# Patient Record
Sex: Female | Born: 1995 | Race: Black or African American | Hispanic: No | Marital: Single | Smoking: Light tobacco smoker
Health system: Southern US, Community
[De-identification: ages and names within clinical notes are randomized; demographics above are authoritative.]

---

## 2015-09-30 ENCOUNTER — Encounter (HOSPITAL_COMMUNITY): Payer: Self-pay | Admitting: Family Medicine

## 2015-09-30 ENCOUNTER — Emergency Department (HOSPITAL_COMMUNITY)
Admission: EM | Admit: 2015-09-30 | Discharge: 2015-09-30 | Discharge status: Against Medical Advice | Payer: BLUE CROSS/BLUE SHIELD | Attending: Emergency Medicine | Admitting: Emergency Medicine

## 2015-09-30 DIAGNOSIS — R202 Paresthesia of skin: Secondary | ICD-10-CM | POA: Insufficient documentation

## 2015-09-30 DIAGNOSIS — F17210 Nicotine dependence, cigarettes, uncomplicated: Secondary | ICD-10-CM | POA: Diagnosis not present

## 2015-09-30 DIAGNOSIS — R200 Anesthesia of skin: Secondary | ICD-10-CM | POA: Insufficient documentation

## 2015-09-30 DIAGNOSIS — R109 Unspecified abdominal pain: Secondary | ICD-10-CM | POA: Insufficient documentation

## 2015-09-30 DIAGNOSIS — R1110 Vomiting, unspecified: Secondary | ICD-10-CM | POA: Diagnosis not present

## 2015-09-30 DIAGNOSIS — M545 Low back pain: Secondary | ICD-10-CM | POA: Insufficient documentation

## 2015-09-30 NOTE — ED Notes (Signed)
Pt is complaining of lower back pain that radiates down both legs. Denies any recent injury. Numbness and tingling to the inner thighs. Denies any loss of bowel or bladder. Also, complains of vomiting once. Mid abd pain before vomiting but has improved since vomiting.

## 2016-05-03 ENCOUNTER — Emergency Department (HOSPITAL_COMMUNITY)
Admission: EM | Admit: 2016-05-03 | Discharge: 2016-05-03 | Disposition: A | Discharge status: Home / Self Care | Payer: BLUE CROSS/BLUE SHIELD | Attending: Emergency Medicine | Admitting: Emergency Medicine

## 2016-05-03 ENCOUNTER — Encounter (HOSPITAL_COMMUNITY): Payer: Self-pay | Admitting: Emergency Medicine

## 2016-05-03 DIAGNOSIS — Y92410 Unspecified street and highway as the place of occurrence of the external cause: Secondary | ICD-10-CM | POA: Diagnosis not present

## 2016-05-03 DIAGNOSIS — Y9389 Activity, other specified: Secondary | ICD-10-CM | POA: Diagnosis not present

## 2016-05-03 DIAGNOSIS — Z7982 Long term (current) use of aspirin: Secondary | ICD-10-CM | POA: Insufficient documentation

## 2016-05-03 DIAGNOSIS — Y999 Unspecified external cause status: Secondary | ICD-10-CM | POA: Diagnosis not present

## 2016-05-03 DIAGNOSIS — F17210 Nicotine dependence, cigarettes, uncomplicated: Secondary | ICD-10-CM | POA: Insufficient documentation

## 2016-05-03 DIAGNOSIS — S161XXA Strain of muscle, fascia and tendon at neck level, initial encounter: Secondary | ICD-10-CM | POA: Diagnosis not present

## 2016-05-03 DIAGNOSIS — S199XXA Unspecified injury of neck, initial encounter: Secondary | ICD-10-CM | POA: Diagnosis present

## 2016-05-03 MED ORDER — METHOCARBAMOL 500 MG PO TABS: 500.0000 mg | ORAL_TABLET | Freq: Two times a day (BID) | ORAL | 0 refills | Status: DC | PRN
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
## 2016-10-06 ENCOUNTER — Ambulatory Visit (INDEPENDENT_AMBULATORY_CARE_PROVIDER_SITE_OTHER): Payer: BLUE CROSS/BLUE SHIELD

## 2016-10-06 ENCOUNTER — Ambulatory Visit (INDEPENDENT_AMBULATORY_CARE_PROVIDER_SITE_OTHER): Payer: BLUE CROSS/BLUE SHIELD | Admitting: Physician Assistant

## 2016-10-06 VITALS — BP 104/76 | HR 71 | Temp 97.7°F | Resp 16 | Ht 64.0 in | Wt 107.0 lb

## 2016-10-06 DIAGNOSIS — M25561 Pain in right knee: Secondary | ICD-10-CM

## 2016-10-06 DIAGNOSIS — M25551 Pain in right hip: Secondary | ICD-10-CM

## 2016-10-06 DIAGNOSIS — S76911A Strain of unspecified muscles, fascia and tendons at thigh level, right thigh, initial encounter: Secondary | ICD-10-CM

## 2016-10-06 MED ORDER — MELOXICAM 7.5 MG PO TABS: 7.5000 mg | ORAL_TABLET | Freq: Every day | ORAL | 1 refills | Status: AC

## 2016-10-06 NOTE — Patient Instructions (Addendum)
Meloxicam is an antiinflammatory. Please do not take other medications like ibuprofen or motrin while taking this. Max dose is 15 mg/day. Take 2 as needed for pain.  Ortho will call you to schedule an appointment.   Patient is to perform exercises below at 5 sets with 10 repetitions. Stretches are to be performed for 5 sets, 10 seconds each. Recommended she perform this rehab twice daily within pain tolerance for 2 weeks.  Go to [facility] medical supply and buy a knee sleeve. Apply heat and/or ice as needed for pain.  Address: [address] [place], [place] [address]  Phone: [phone]  Thank you for coming in today. I hope you feel we met your needs.  Feel free to call [facility] if you have any questions or further requests.  Please consider signing up for MyChart if you do not already have it, as this is a great way to communicate with me.  Best,  Mercer Pod, PA-C  Hamstring Strain Rehab Ask your health care provider which exercises are safe for you. Do exercises exactly as told by your health care provider and adjust them as directed. It is normal to feel mild stretching, pulling, tightness, or discomfort as you do these exercises, but you should stop right away if you feel sudden pain or your pain gets worse.Do not begin these exercises until told by your health care provider. Strengthening exercises These exercises build strength and endurance in your thighs. Endurance is the ability to use your muscles for a long time, even after your muscles get tired. Exercise A: Straight leg raises (hip extensors) 1. Lie on your belly on a firm surface. 2. Tense the muscles in your buttocks to lift your left / right leg about 4 inches (10 cm). Keep your knee straight. 3. If you cannot lift your leg this high without arching your back, place a pillow under your hips. 4. Hold the position for __________ seconds. 5. Slowly lower your leg to the starting position and allow it to relax completely before  you start the next repetition. Repeat __________ times. Complete this exercise __________ times a day. Exercise B: Bridge (hip extensors) 1. Lie on your back on a firm surface with your knees bent and your feet flat on the floor. 2. Tighten your buttocks muscles and lift your bottom off the floor until your trunk is level with your thighs.  You should feel the muscles working in your buttocks and the back of your thighs.  Do not arch your back. 3. Hold this position for __________ seconds. 4. Slowly lower your hips to the starting position. 5. Let your buttocks muscles relax completely between repetitions. Repeat __________ times. Complete this exercise __________ times a day. If told by your health care provider, keep your bottom lifted off the floor while you slowly walk your feet away from you as far as you can control. Hold for __________ seconds, then slowly walk your feet back toward you. Exercise C: Lateral walking with band (hip abductors) 1. Stand in a long hallway. 2. Wrap a loop of exercise band around your legs, just above your knees. 3. Bend your knees gently and drop your hips down and back so your weight is over your heels. 4. Step to the side to move down the length of the hallway, keeping your toes pointed ahead of you and keeping tension in the band. 5. Repeat, leading with your other leg. Repeat __________ times. Complete this exercise __________ times a day. Exercise D: Single  leg stand with reaching (eccentric hamstring) 1. Stand on your left / right foot. Keep your big toe down on the floor and try to keep your arch lifted. 2. Slowly reach down toward the floor as far as you can while keeping your balance. 3. Hold this position for __________ seconds. Repeat __________ times. Complete this exercise __________ times a day. Exercise E: Prone plank (abdominals and core) 1. Lie on your belly on the floor and prop yourself up on your elbows. Your hands should be straight  out in front of you, and your elbows should be below your shoulders. Position your feet so the balls of your feet touch the ground. The ball of your foot is on the walking surface, right under your toes. 2. Tighten your abdominal muscles and lift your body off the floor.  Do not arch your back.  Do not hold your breath. 3. Hold this position for __________ seconds. Repeat __________ times. Complete this exercise __________ times a day. This information is not intended to replace advice given to you by your health care provider. Make sure you discuss any questions you have with your health care provider. Document Released: 08/30/2005 Document Revised: 05/06/2016 Document Reviewed: 05/29/2015 Elsevier Interactive Patient Education  2017 [employer].  IF you received an x-ray today, you will receive an invoice from [facility]. Please contact [facility] at [phone] with questions or concerns regarding your invoice.   IF you received labwork today, you will receive an invoice from [facility]. Please contact [facility] at [phone] with questions or concerns regarding your invoice.   Our billing staff will not be able to assist you with questions regarding bills from these companies.  You will be contacted with the lab results as soon as they are available. The fastest way to get your results is to activate your My Chart account. Instructions are located on the last page of this paperwork. If you have not heard from [place] regarding the results in 2 weeks, please contact this office.

## 2016-10-06 NOTE — Progress Notes (Signed)
Julie Weiss  MRN: [id number] DOB: 1996-05-17  PCP: No PCP Per Patient  Subjective:  Pt is a 21 year old female who presents to clinic for leg pain x 2 weeks.  Mostly in the inside of her right knee. Pain is a 7/10.  She slipped in the snow, twisting her knee, which made the pain worse - pain was present about a day before this - Felt a pop on the inside of her right knee. Immediate swelling +swelling x a few days, better now. Pain is constant, even during sleep. Nothing makes it better.  She sleeps in fetal position, the pressure of knee-on-knee hurt, despite which side she is lying on. Increased pain when she bends.  She has tried ice, elevation, Aspirin, helps some then gets worse again. No history of knee problems.  Ran track and cheered in highschool. No sports at this time.  Denies redness, warmth, fever, chills.   Review of Systems  Constitutional: Negative for chills, diaphoresis, fatigue and fever.  Respiratory: Negative for cough, chest tightness, shortness of breath and wheezing.   Cardiovascular: Negative for chest pain and palpitations.  Gastrointestinal: Negative for abdominal pain, diarrhea, nausea and vomiting.  Musculoskeletal: Positive for arthralgias (right knee), gait problem, joint swelling and myalgias (right thigh).  Skin: Negative.   Neurological: Negative for weakness, light-headedness and headaches.  Psychiatric/Behavioral: Positive for sleep disturbance.    There are no active problems to display for this patient.   No current outpatient prescriptions on file prior to visit.   No current facility-administered medications on file prior to visit.     No Known Allergies   Objective:  BP 104/76   Pulse 71   Temp 97.7 F (36.5 C) (Oral)   Resp 16   Ht 5\' 4"  (1.626 m)   Wt 107 lb (48.5 kg)   LMP 10/06/2016   SpO2 100%   BMI 18.37 kg/m   Physical Exam  Constitutional: She is oriented to person, place, and time and well-developed, well-nourished,  and in no distress. No distress.  Cardiovascular: Normal rate, regular rhythm and normal heart sounds.   Musculoskeletal:       Right knee: She exhibits abnormal meniscus. She exhibits normal range of motion, no swelling, no effusion, no deformity, no erythema, normal alignment, no LCL laxity and no MCL laxity. No medial joint line, no lateral joint line, no MCL, no LCL and no patellar tendon tenderness noted.  Pt walks with a limp favoring right leg. Cannot squat past 100 degrees due to pain. TTP medial femoral epicondyle. TTP along medial right quadriceps. Pain radiating to hip with external hip rotation. No redness, swelling, warmth. Strength 5/5. Sensation in tact.   Neurological: She is alert and oriented to person, place, and time. GCS score is 15.  Skin: Skin is warm and dry.  Psychiatric: Mood, memory, affect and judgment normal.  Vitals reviewed.  Dg Knee Complete 4 Views Right  Result Date: 10/06/2016 CLINICAL DATA:  Knee pain. EXAM: RIGHT KNEE - COMPLETE 4+ VIEW COMPARISON:  No recent prior . FINDINGS: No acute bony or joint abnormality identified. No evidence of fracture dislocation. IMPRESSION: No acute or focal abnormality. Electronically Signed   By: Maisie Fus  Register   On: 10/06/2016 09:25   Dg Hip Unilat W Or W/o Pelvis 2-3 Views Right  Result Date: 10/06/2016 CLINICAL DATA:  Right hip pain for 1 week. EXAM: DG HIP (WITH OR WITHOUT PELVIS) 2-3V RIGHT COMPARISON:  None. FINDINGS: Pelvic bony ring is intact.  No gross abnormality to either hip. Right hip is located without a fracture. Gas and stool in the colon. Calcification in the right hemipelvis probably represents a phlebolith. IMPRESSION: No acute abnormality. Electronically Signed   By: Richarda OverlieAdam  Henn M.D.   On: 10/06/2016 10:15    Assessment and Plan :  1. Muscle strain of right thigh, initial encounter 2. Acute pain of right knee 3. Pain of right hip joint - Ambulatory referral to Orthopedic Surgery - DG Knee Complete 4  Views Right; Future - meloxicam (MOBIC) 7.5 MG tablet; Take 1 tablet (7.5 mg total) by mouth daily.  Dispense: 30 tablet; Refill: 1 - DG HIP UNILAT W OR W/O PELVIS 2-3 VIEWS RIGHT; Future - Suspect possible muscle strain. Supportive care discussed: heat/ice, NSAID, stretching. RTC if symptoms worsen. Will refer to ortho, concern for possible meniscal injury.   Marco CollieWhitney Diogenes Whirley, PA-C  [facility] 10/06/2016 8:51 AM

## 2016-10-16 ENCOUNTER — Emergency Department (HOSPITAL_COMMUNITY)
Admission: EM | Admit: 2016-10-16 | Discharge: 2016-10-16 | Disposition: A | Discharge status: Home / Self Care | Payer: BLUE CROSS/BLUE SHIELD | Attending: Emergency Medicine | Admitting: Emergency Medicine

## 2016-10-16 ENCOUNTER — Encounter (HOSPITAL_COMMUNITY): Payer: Self-pay | Admitting: Emergency Medicine

## 2016-10-16 ENCOUNTER — Emergency Department (HOSPITAL_COMMUNITY): Payer: BLUE CROSS/BLUE SHIELD

## 2016-10-16 DIAGNOSIS — Z7982 Long term (current) use of aspirin: Secondary | ICD-10-CM | POA: Diagnosis not present

## 2016-10-16 DIAGNOSIS — B9689 Other specified bacterial agents as the cause of diseases classified elsewhere: Secondary | ICD-10-CM | POA: Insufficient documentation

## 2016-10-16 DIAGNOSIS — N760 Acute vaginitis: Secondary | ICD-10-CM | POA: Insufficient documentation

## 2016-10-16 DIAGNOSIS — F17210 Nicotine dependence, cigarettes, uncomplicated: Secondary | ICD-10-CM | POA: Insufficient documentation

## 2016-10-16 DIAGNOSIS — N72 Inflammatory disease of cervix uteri: Secondary | ICD-10-CM | POA: Insufficient documentation

## 2016-10-16 DIAGNOSIS — Z79899 Other long term (current) drug therapy: Secondary | ICD-10-CM | POA: Diagnosis not present

## 2016-10-16 DIAGNOSIS — R109 Unspecified abdominal pain: Secondary | ICD-10-CM | POA: Diagnosis present

## 2016-10-16 DIAGNOSIS — N390 Urinary tract infection, site not specified: Secondary | ICD-10-CM | POA: Insufficient documentation

## 2016-10-16 DIAGNOSIS — R1030 Lower abdominal pain, unspecified: Secondary | ICD-10-CM

## 2016-10-16 DIAGNOSIS — R1031 Right lower quadrant pain: Secondary | ICD-10-CM | POA: Diagnosis not present

## 2016-10-16 LAB — URINALYSIS, ROUTINE W REFLEX MICROSCOPIC
Bilirubin Urine: NEGATIVE
GLUCOSE, UA: NEGATIVE mg/dL
Hgb urine dipstick: NEGATIVE
Ketones, ur: NEGATIVE mg/dL
Nitrite: NEGATIVE
PROTEIN: NEGATIVE mg/dL
Specific Gravity, Urine: 1.004 — ABNORMAL LOW (ref 1.005–1.030)
pH: 6 (ref 5.0–8.0)

## 2016-10-16 LAB — COMPREHENSIVE METABOLIC PANEL
ALT: 9 U/L — AB (ref 14–54)
AST: 17 U/L (ref 15–41)
Albumin: 3.9 g/dL (ref 3.5–5.0)
Alkaline Phosphatase: 50 U/L (ref 38–126)
Anion gap: 6 (ref 5–15)
BUN: 11 mg/dL (ref 6–20)
CHLORIDE: 100 mmol/L — AB (ref 101–111)
CO2: 29 mmol/L (ref 22–32)
CREATININE: 0.87 mg/dL (ref 0.44–1.00)
Calcium: 9 mg/dL (ref 8.9–10.3)
GFR calc non Af Amer: >60 mL/min (ref >60)
Glucose, Bld: 111 mg/dL — ABNORMAL HIGH (ref 65–99)
POTASSIUM: 3.6 mmol/L (ref 3.5–5.1)
SODIUM: 135 mmol/L (ref 135–145)
Total Bilirubin: 0.4 mg/dL (ref 0.3–1.2)
Total Protein: 8.5 g/dL — ABNORMAL HIGH (ref 6.5–8.1)

## 2016-10-16 LAB — LIPASE, BLOOD: LIPASE: 14 U/L (ref 11–51)

## 2016-10-16 LAB — CBC
HEMATOCRIT: 33 % — AB (ref 36.0–46.0)
Hemoglobin: 11.4 g/dL — ABNORMAL LOW (ref 12.0–15.0)
MCH: 32.4 pg (ref 26.0–34.0)
MCHC: 34.5 g/dL (ref 30.0–36.0)
MCV: 93.8 fL (ref 78.0–100.0)
PLATELETS: 336 10*3/uL (ref 150–400)
RBC: 3.52 MIL/uL — ABNORMAL LOW (ref 3.87–5.11)
RDW: 11.6 % (ref 11.5–15.5)
WBC: 7.9 10*3/uL (ref 4.0–10.5)

## 2016-10-16 LAB — I-STAT BETA HCG BLOOD, ED (MC, WL, AP ONLY)

## 2016-10-16 LAB — WET PREP, GENITAL
Sperm: NONE SEEN
Trich, Wet Prep: NONE SEEN
YEAST WET PREP: NONE SEEN

## 2016-10-16 MED ORDER — SODIUM CHLORIDE 0.9 % IJ SOLN
INTRAMUSCULAR | Status: AC
  Filled 2016-10-16: qty 50

## 2016-10-16 MED ORDER — HYDROCODONE-ACETAMINOPHEN 5-325 MG PO TABS: 1.0000 | ORAL_TABLET | ORAL | 0 refills | Status: AC | PRN

## 2016-10-16 MED ORDER — CEPHALEXIN 500 MG PO CAPS: 500.0000 mg | ORAL_CAPSULE | Freq: Three times a day (TID) | ORAL | 0 refills | Status: AC

## 2016-10-16 MED ORDER — IBUPROFEN 600 MG PO TABS: 600.0000 mg | ORAL_TABLET | Freq: Four times a day (QID) | ORAL | 0 refills | Status: AC | PRN

## 2016-10-16 MED ORDER — AZITHROMYCIN 250 MG PO TABS
1000.0000 mg | ORAL_TABLET | Freq: Once | ORAL | Status: AC
  Administered 2016-10-16: 1000 mg via ORAL
  Filled 2016-10-16: qty 4

## 2016-10-16 MED ORDER — ONDANSETRON HCL 4 MG/2ML IJ SOLN
4.0000 mg | Freq: Once | INTRAMUSCULAR | Status: AC
Start: 2016-10-16 — End: 2016-10-16
  Administered 2016-10-16: 4 mg via INTRAVENOUS
  Filled 2016-10-16: qty 2

## 2016-10-16 MED ORDER — DEXTROSE 5 % IV SOLN
1.0000 g | Freq: Once | INTRAVENOUS | Status: AC
  Administered 2016-10-16: 1 g via INTRAVENOUS
  Filled 2016-10-16: qty 10

## 2016-10-16 MED ORDER — MORPHINE SULFATE (PF) 2 MG/ML IV SOLN
2.0000 mg | Freq: Once | INTRAVENOUS | Status: AC
  Administered 2016-10-16: 2 mg via INTRAVENOUS
  Filled 2016-10-16: qty 1

## 2016-10-16 MED ORDER — SODIUM CHLORIDE 0.9 % IV BOLUS (SEPSIS)
1000.0000 mL | Freq: Once | INTRAVENOUS | Status: AC
  Administered 2016-10-16: 1000 mL via INTRAVENOUS

## 2016-10-16 MED ORDER — METRONIDAZOLE 500 MG PO TABS
500.0000 mg | ORAL_TABLET | Freq: Two times a day (BID) | ORAL | 0 refills | Status: AC
Start: 2016-10-16 — End: ?

## 2016-10-16 MED ORDER — ONDANSETRON HCL 4 MG PO TABS: 4.0000 mg | ORAL_TABLET | Freq: Three times a day (TID) | ORAL | 0 refills | Status: AC | PRN

## 2016-10-16 MED ORDER — IOPAMIDOL (ISOVUE-300) INJECTION 61%
INTRAVENOUS | Status: AC
  Filled 2016-10-16: qty 100

## 2016-10-16 MED ORDER — IOPAMIDOL (ISOVUE-300) INJECTION 61%
INTRAVENOUS | Status: AC
  Administered 2016-10-16: 100 mL via INTRAVENOUS
  Filled 2016-10-16: qty 30

## 2016-10-16 MED ORDER — IOPAMIDOL (ISOVUE-300) INJECTION 61%
30.0000 mL | Freq: Once | INTRAVENOUS | Status: AC | PRN
  Administered 2016-10-16: 30 mL via ORAL

## 2016-10-16 NOTE — ED Provider Notes (Signed)
[facility]-EMERGENCY DEPT Provider Note   CSN: [id number] Arrival date & time: 10/16/16  1713     History   Chief Complaint Chief Complaint  Patient presents with  . Abdominal Pain  . Diarrhea    HPI Julie Weiss is a 21 y.o. female.  Patient presents with 4 days of gradual onset right-sided abdominal pain. Pain is worse with movement and lying flat. Denies any nausea or vomiting. Patient states she took a laxative 2 days ago after having 2 days of constipation. She had several loose stools. No blood in the stool. No relief of her symptoms. She's not had any fever or chills. She denies any dysuria, frequency or urgency. She does state that her urine has been darker than normal. Last menstrual period was the end of January. Denies any vaginal bleeding or discharge. No history of previous abdominal surgeries.   HPI History reviewed. No pertinent past medical history.  There are no active problems to display for this patient.   History reviewed. No pertinent surgical history.  OB History    No data available       Home Medications    Prior to Admission medications   Medication Sig Start Date End Date Taking? Authorizing Provider  aspirin 325 MG tablet Take 650 mg by mouth once.   Yes Historical Provider, MD  cephALEXin (KEFLEX) 500 MG capsule Take 1 capsule (500 mg total) by mouth 3 (three) times daily. 10/16/16   Loren Raceravid Robinson Brinkley, MD  HYDROcodone-acetaminophen (NORCO) 5-325 MG tablet Take 1 tablet by mouth every 4 (four) hours as needed for severe pain. 10/16/16   Loren Raceravid Royce Sciara, MD  ibuprofen (ADVIL,MOTRIN) 600 MG tablet Take 1 tablet (600 mg total) by mouth every 6 (six) hours as needed. 10/16/16   Loren Raceravid Dorrien Grunder, MD  meloxicam (MOBIC) 7.5 MG tablet Take 1 tablet (7.5 mg total) by mouth daily. Patient not taking: Reported on 10/16/2016 10/06/16   Madelaine BhatElizabeth Whitney McVey, PA-C  metroNIDAZOLE (FLAGYL) 500 MG tablet Take 1 tablet (500 mg total) by mouth 2 (two) times daily. One po bid x 7  days 10/16/16   Loren Raceravid Anacleto Batterman, MD  ondansetron (ZOFRAN) 4 MG tablet Take 1 tablet (4 mg total) by mouth every 8 (eight) hours as needed for nausea or vomiting. 10/16/16   Loren Raceravid Ruey Storer, MD    Family History No family history on file.  Social History Social History  Substance Use Topics  . Smoking status: Light Tobacco Smoker    Types: Cigarettes  . Smokeless tobacco: Never Used  . Alcohol use Yes     Comment: Once every couple of weeks.      Allergies   Patient has no known allergies.   Review of Systems Review of Systems  Constitutional: Negative for chills, fatigue and fever.  Respiratory: Negative for cough and shortness of breath.   Cardiovascular: Negative for chest pain and palpitations.  Gastrointestinal: Positive for abdominal pain and constipation. Negative for abdominal distention, blood in stool, diarrhea, nausea and vomiting.  Genitourinary: Negative for difficulty urinating, dysuria, flank pain, frequency, hematuria, pelvic pain, urgency, vaginal bleeding and vaginal discharge.  Musculoskeletal: Negative for back pain, myalgias, neck pain and neck stiffness.  Skin: Negative for rash and wound.  Neurological: Negative for dizziness, syncope, weakness, light-headedness and numbness.  All other systems reviewed and are negative.    Physical Exam Updated Vital Signs BP 120/76   Pulse 74   Temp 97.9 F (36.6 C) (Oral)   Resp 18   Ht 5\' 4"  (1.626  m)   Wt 110 lb 5 oz (50 kg)   LMP 10/04/2016   SpO2 100%   BMI 18.94 kg/m   Physical Exam  Constitutional: She is oriented to person, place, and time. She appears well-developed and well-nourished.  Patient is tearful  HENT:  Head: Normocephalic and atraumatic.  Mouth/Throat: Oropharynx is clear and moist. No oropharyngeal exudate.  Eyes: EOM are normal. Pupils are equal, round, and reactive to light.  Neck: Normal range of motion. Neck supple.  Cardiovascular: Normal rate and regular rhythm.     Pulmonary/Chest: Effort normal and breath sounds normal.  Abdominal: Soft. Bowel sounds are normal. There is tenderness. There is no rebound and no guarding.  Right upper quadrant and right lower quadrant tenderness to palpation. There is most pronounced in the right lower quadrant. No definite guarding or rebound.  Musculoskeletal: Normal range of motion. She exhibits no edema, tenderness or deformity.  No CVA tenderness bilaterally.  Neurological: She is alert and oriented to person, place, and time.  Skin: Skin is warm and dry. Capillary refill takes less than 2 seconds. No rash noted. No erythema.  Psychiatric: She has a normal mood and affect. Her behavior is normal.  Nursing note and vitals reviewed.    ED Treatments / Results  Labs (all labs ordered are listed, but only abnormal results are displayed) Labs Reviewed  WET PREP, GENITAL - Abnormal; Notable for the following:       Result Value   Clue Cells Wet Prep HPF POC PRESENT (*)    WBC, Wet Prep HPF POC FEW (*)    All other components within normal limits  COMPREHENSIVE METABOLIC PANEL - Abnormal; Notable for the following:    Chloride 100 (*)    Glucose, Bld 111 (*)    Total Protein 8.5 (*)    ALT 9 (*)    All other components within normal limits  CBC - Abnormal; Notable for the following:    RBC 3.52 (*)    Hemoglobin 11.4 (*)    HCT 33.0 (*)    All other components within normal limits  URINALYSIS, ROUTINE W REFLEX MICROSCOPIC - Abnormal; Notable for the following:    Color, Urine STRAW (*)    Specific Gravity, Urine 1.004 (*)    Leukocytes, UA LARGE (*)    Bacteria, UA RARE (*)    Squamous Epithelial / LPF 0-5 (*)    All other components within normal limits  LIPASE, BLOOD  I-STAT BETA HCG BLOOD, ED ([facility], [facility], [facility] ONLY)  GC/CHLAMYDIA PROBE AMP ([place]) NOT AT [facility]    EKG  EKG Interpretation None       Radiology Ct Abdomen Pelvis W Contrast  Result Date: 10/16/2016 CLINICAL DATA:  Right abdominal  pain with associated diarrhea for 4 days. EXAM: CT ABDOMEN AND PELVIS WITH CONTRAST TECHNIQUE: Multidetector CT imaging of the abdomen and pelvis was performed using the standard protocol following bolus administration of intravenous contrast. CONTRAST:  ISOVUE-300 IOPAMIDOL (ISOVUE-300) INJECTION 61%, 30mL ISOVUE-300 IOPAMIDOL (ISOVUE-300) INJECTION 61% COMPARISON:  None. FINDINGS: Lower chest: No acute abnormality. Hepatobiliary: No focal liver abnormality is seen. No gallstones, gallbladder wall thickening, or biliary dilatation. Pancreas: Unremarkable. No pancreatic ductal dilatation or surrounding inflammatory changes. Spleen: Normal in size without focal abnormality. Adrenals/Urinary Tract: Adrenal glands appear normal. Kidneys are unremarkable without mass, stone or hydronephrosis. No ureteral or bladder calculi identified. Bladder appears normal. Stomach/Bowel: Bowel is normal in caliber. No bowel wall thickening or convincing evidence of bowel  wall inflammation seen. Appendix is not seen in its entirety but visualized portion appears normal in caliber and normally air-filled. Stomach appears normal. Vascular/Lymphatic: No significant vascular findings are present. No enlarged abdominal or pelvic lymph nodes. Reproductive: Uterus and bilateral adnexa are unremarkable. Other: Small amount of free fluid in the lower pelvis is likely physiologic in nature. No other free fluid. No abscess collection or free intraperitoneal air seen. Musculoskeletal: No acute or significant osseous findings. IMPRESSION: No acute/significant findings within the abdomen or pelvis. No bowel obstruction or evidence of bowel wall inflammation seen. No significant free fluid. No evidence of acute solid organ abnormality. No renal or ureteral calculi seen. No evidence of appendicitis. Electronically Signed   By: Bary Richard M.D.   On: 10/16/2016 20:49    Procedures Procedures (including critical care time)  Medications  Ordered in ED Medications  morphine 2 MG/ML injection 2 mg (2 mg Intravenous Given 10/16/16 1834)  ondansetron (ZOFRAN) injection 4 mg (4 mg Intravenous Given 10/16/16 1832)  sodium chloride 0.9 % bolus 1,000 mL (0 mLs Intravenous Stopped 10/16/16 1938)  iopamidol (ISOVUE-300) 61 % injection (100 mLs Intravenous Contrast Given 10/16/16 2009)  iopamidol (ISOVUE-300) 61 % injection 30 mL (30 mLs Oral Contrast Given 10/16/16 2009)  cefTRIAXone (ROCEPHIN) 1 g in dextrose 5 % 50 mL IVPB (0 g Intravenous Stopped 10/16/16 2232)  azithromycin (ZITHROMAX) tablet 1,000 mg (1,000 mg Oral Given 10/16/16 2235)     Initial Impression / Assessment and Plan / ED Course  I have reviewed the triage vital signs and the nursing notes.  Pertinent labs & imaging results that were available during my care of the patient were reviewed by me and considered in my medical decision making (see chart for details).     Evidence of UTI on UA. Patient had right-sided adnexal tenderness, cervical motion tenderness and vaginal discharge on pelvic exam per PA. Question PID. Given IV Rocephin and oral azithromycin. Will start on Keflex. Advised follow-up with health Department. Repeat abdominal exam is benign. No rebound or guarding. Return precautions given.  Final Clinical Impressions(s) / ED Diagnoses   Final diagnoses:  Acute lower UTI  Bacterial vaginosis  Cervicitis  Lower abdominal pain    New Prescriptions Discharge Medication List as of 10/16/2016 10:33 PM    START taking these medications   Details  cephALEXin (KEFLEX) 500 MG capsule Take 1 capsule (500 mg total) by mouth 3 (three) times daily., Starting Sat 10/16/2016, Print    HYDROcodone-acetaminophen (NORCO) 5-325 MG tablet Take 1 tablet by mouth every 4 (four) hours as needed for severe pain., Starting Sat 10/16/2016, Print    ibuprofen (ADVIL,MOTRIN) 600 MG tablet Take 1 tablet (600 mg total) by mouth every 6 (six) hours as needed., Starting Sat 10/16/2016, Print      metroNIDAZOLE (FLAGYL) 500 MG tablet Take 1 tablet (500 mg total) by mouth 2 (two) times daily. One po bid x 7 days, Starting Sat 10/16/2016, Print    ondansetron (ZOFRAN) 4 MG tablet Take 1 tablet (4 mg total) by mouth every 8 (eight) hours as needed for nausea or vomiting., Starting Sat 10/16/2016, Print         Loren Racer, MD 10/17/16 1620

## 2016-10-16 NOTE — ED Provider Notes (Signed)
Pelvic Exam Procedure note  Pelvic exam performed by myself with Judeth CornfieldStephanie, RN chaperone   Moderate white/yellow cervical and vaginal discharge; also noted externally prior to exam; no blood noted No adnexal tenderness, masses, or fullness. Positive CMT.   Julie Holeslexandra M Makhia Vosler, PA-C 10/16/16 2041    Loren Raceravid Yelverton, MD 10/20/16 [phone]

## 2016-10-16 NOTE — ED Triage Notes (Signed)
Pt verbalizes right abdominal pain with associated diarrhea for four days; [facility]

## 2016-10-18 LAB — GC/CHLAMYDIA PROBE AMP (~~LOC~~) NOT AT ARMC
CHLAMYDIA, DNA PROBE: POSITIVE — AB
Neisseria Gonorrhea: NEGATIVE

## 2018-03-21 IMAGING — DX DG HIP (WITH OR WITHOUT PELVIS) 2-3V*R*
3 series · 3 of 3 positions shown · non-contrast
Comparison: None.

CLINICAL DATA: Right hip pain for 1 week.

EXAM:
DG HIP (WITH OR WITHOUT PELVIS) 2-3V RIGHT

[pelvis ap]
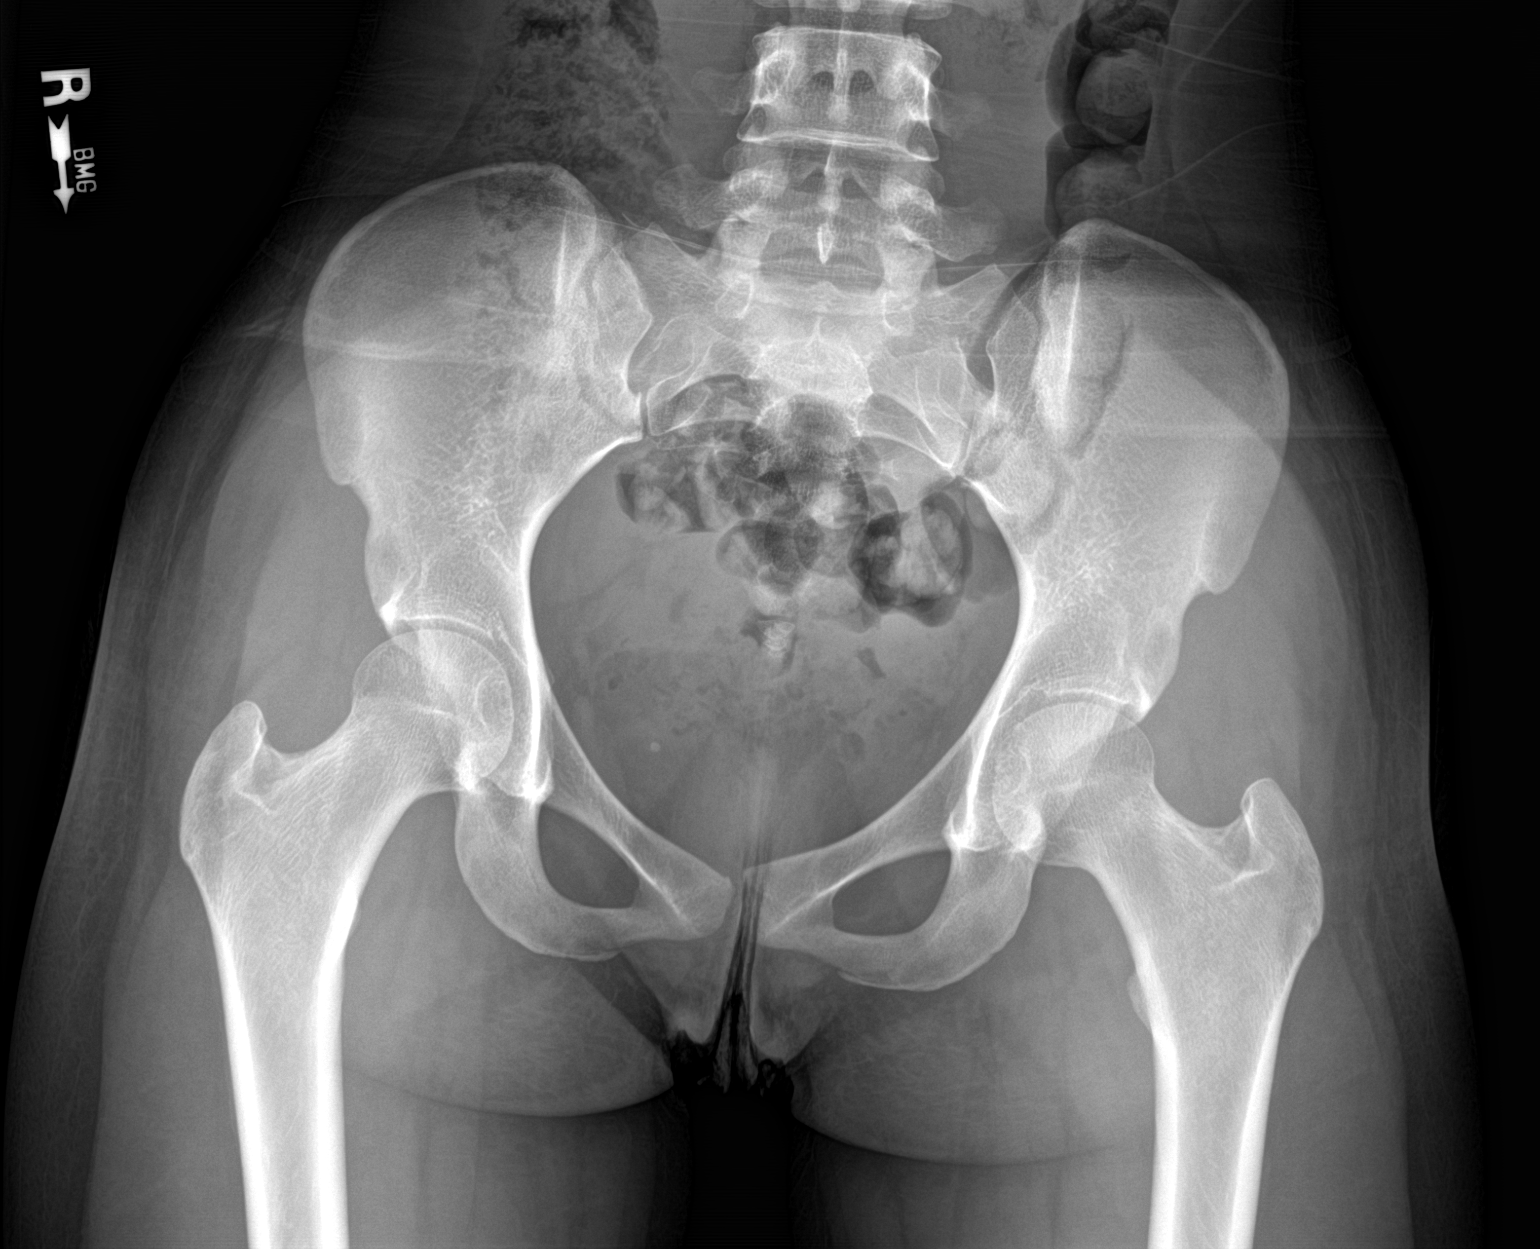

[hip ap]
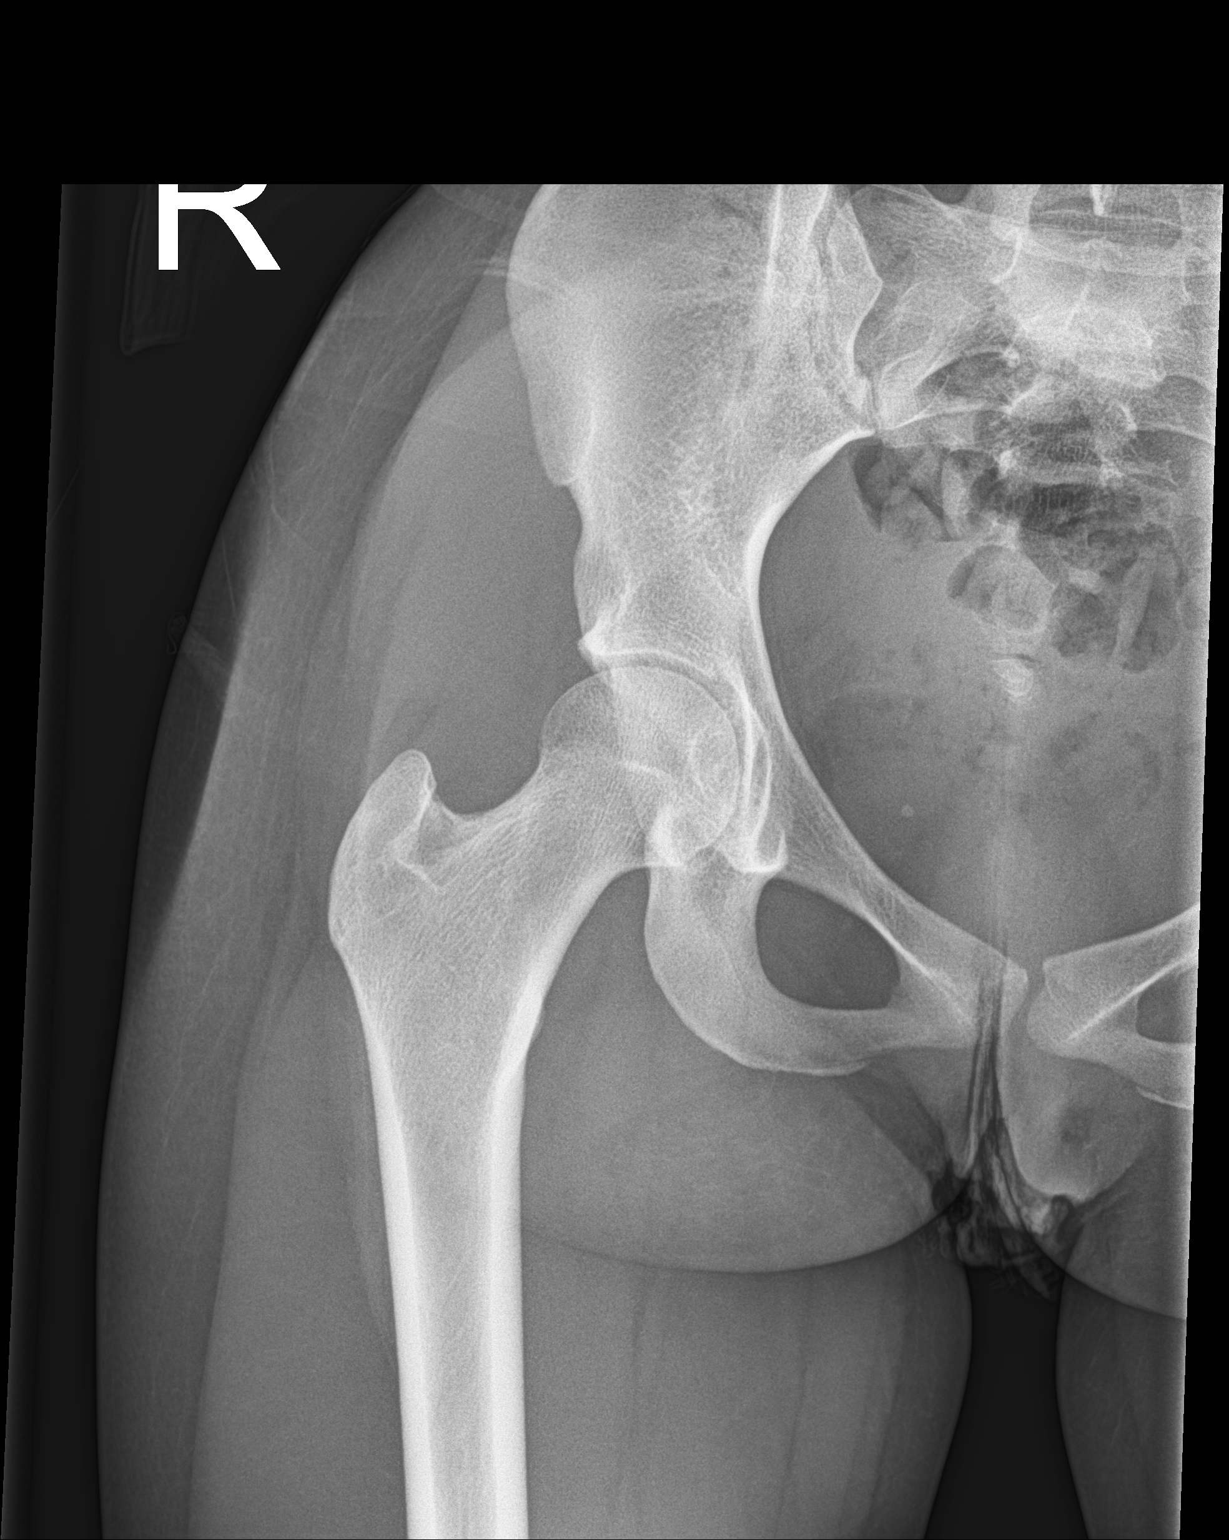

[hip lat]
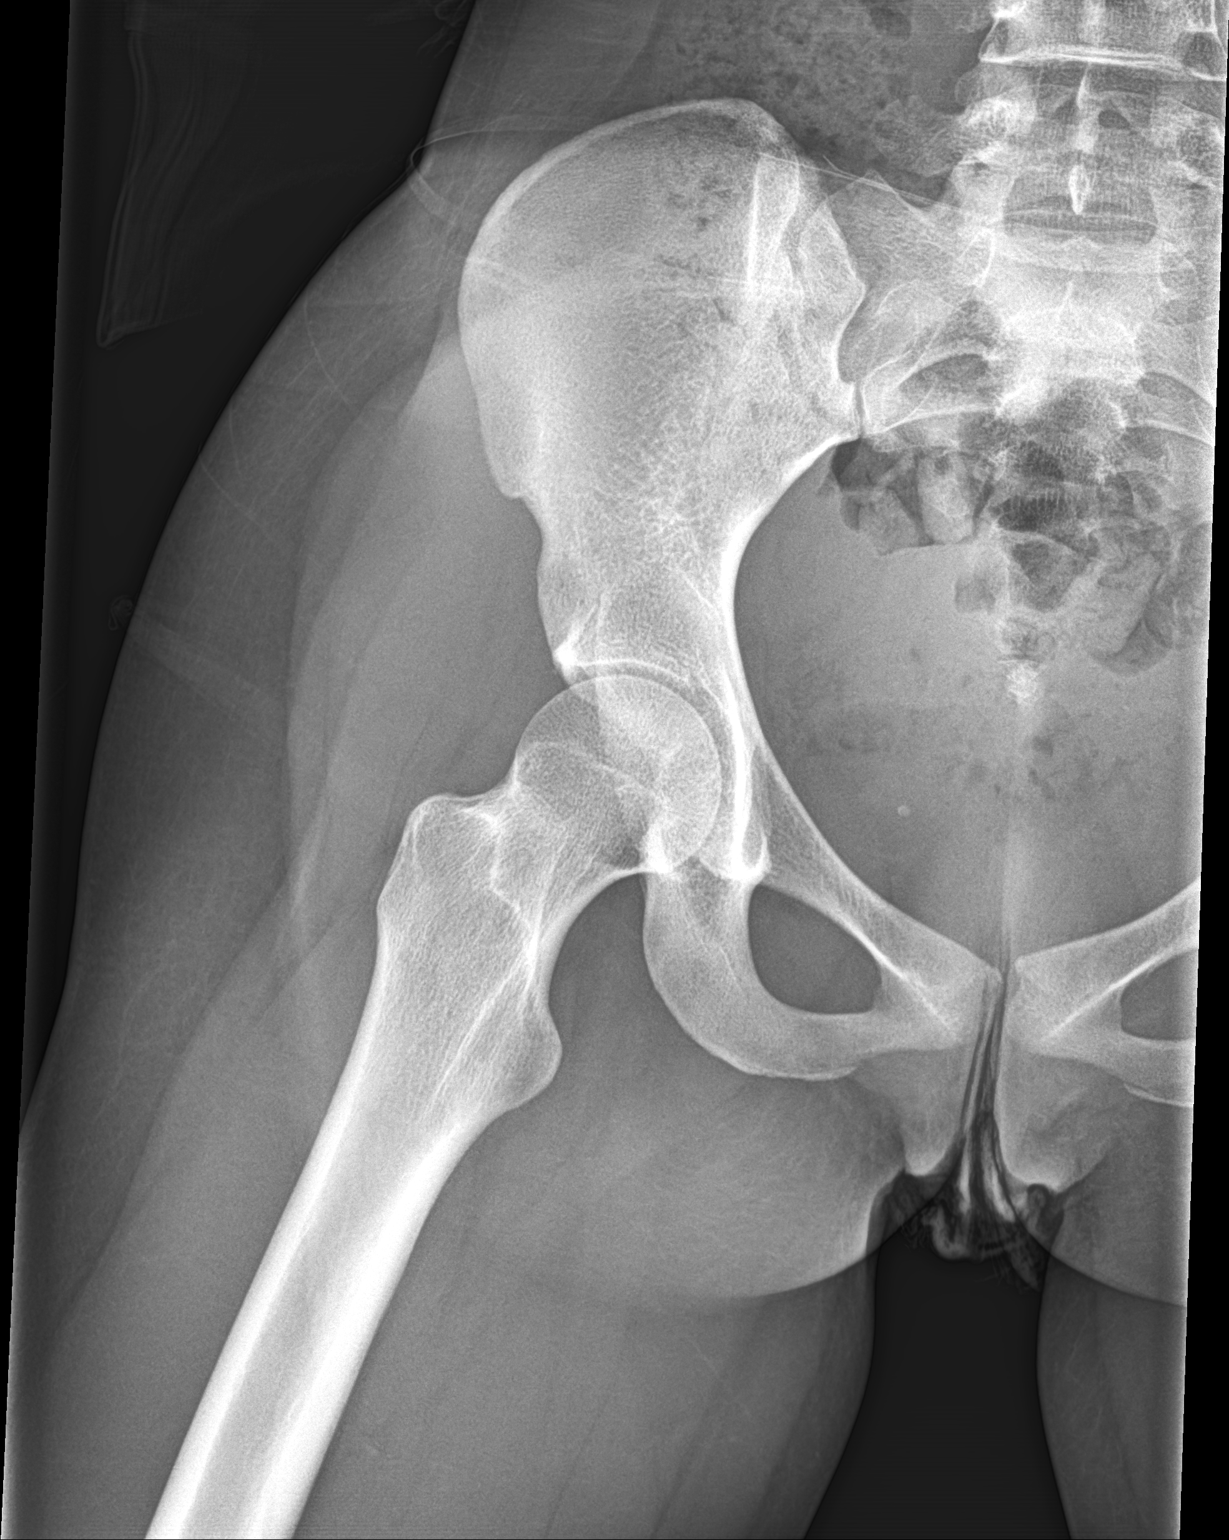

[3 of 3 positions shown; findings below may reference images not displayed]

FINDINGS: Pelvic bony ring is intact. No gross abnormality to either hip.
Right hip is located without a fracture. Gas and stool in the colon.
Calcification in the right hemipelvis probably represents a
phlebolith.
IMPRESSION: No acute abnormality.

## 2018-03-31 IMAGING — CT CT ABD-PELV W/ CM
2 of 4 series · 16 of 46 positions shown, 18 images · IV contrast (ISOVUE)
Comparison: None.

CLINICAL DATA: Right abdominal pain with associated diarrhea for 4
days.

EXAM:
CT ABDOMEN AND PELVIS WITH CONTRAST
TECHNIQUE: Multidetector CT imaging of the abdomen and pelvis was performed
using the standard protocol following bolus administration of
intravenous contrast.
CONTRAST:  100mL 0HK35I-HUU IOPAMIDOL (0HK35I-HUU) INJECTION 61%,
30mL 0HK35I-HUU IOPAMIDOL (0HK35I-HUU) INJECTION 61%

[Series 2: abd/pel with · axial · 0.67mm/px · z∈[-10,+375]mm · 13 of 87 slices shown, 15 images]
[im 5/87  soft-tissue]
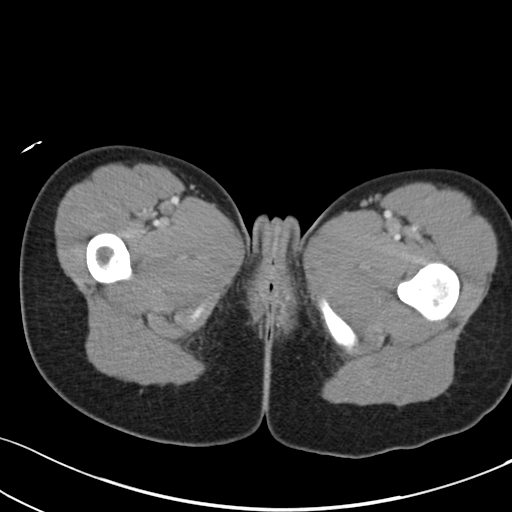
[im 5/87  bone]
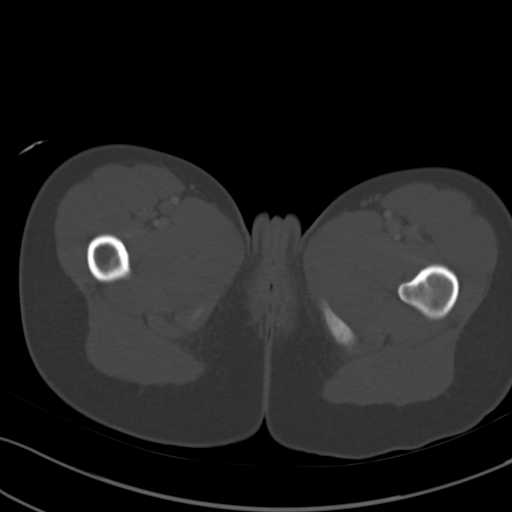
[im 13/87  soft-tissue]
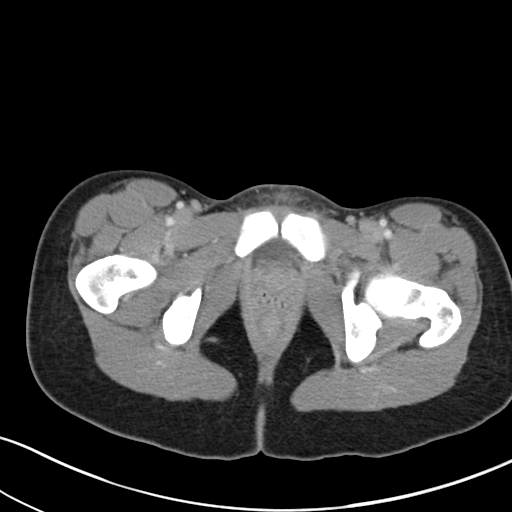
[im 18/87  soft-tissue]
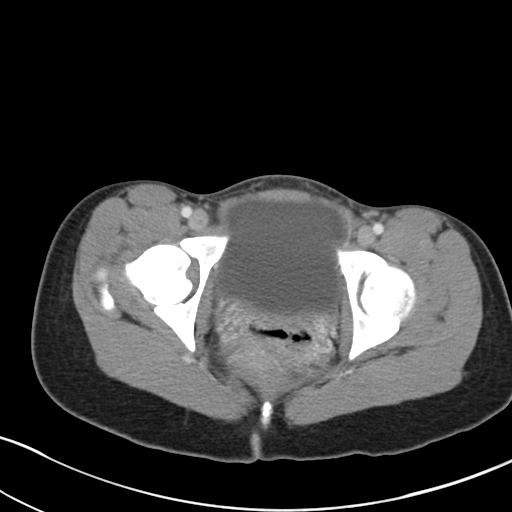
[im 26/87  soft-tissue]
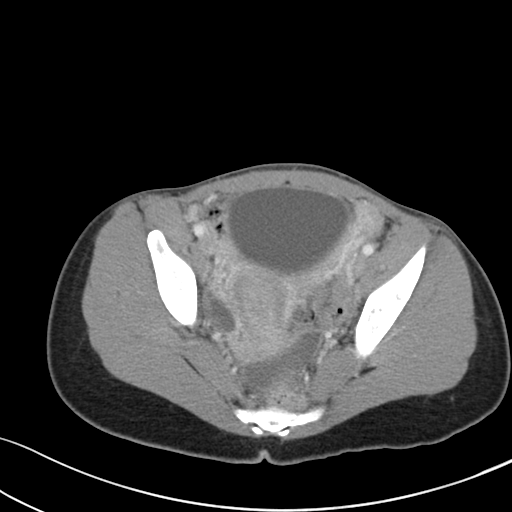
[im 31/87  soft-tissue]
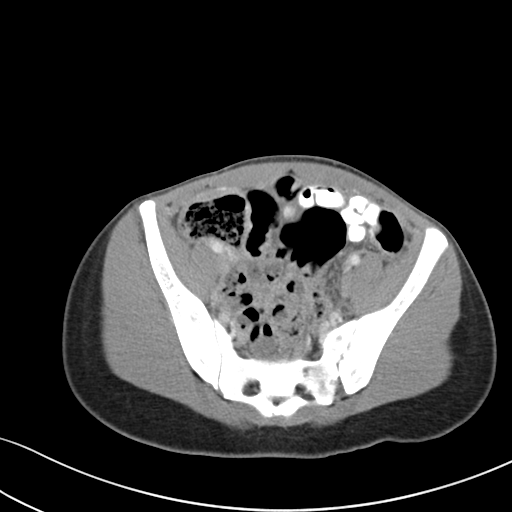
[im 39/87  soft-tissue]
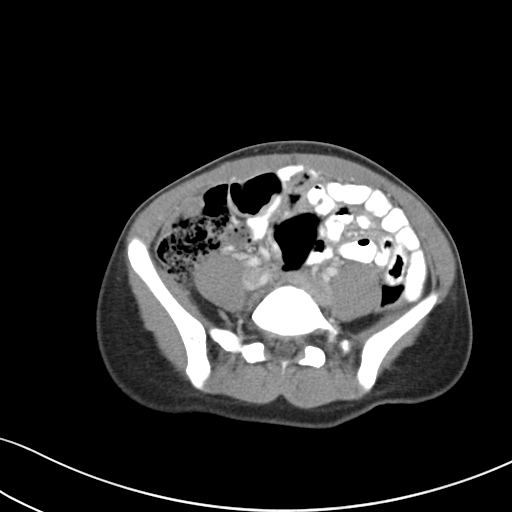
[im 44/87  soft-tissue]
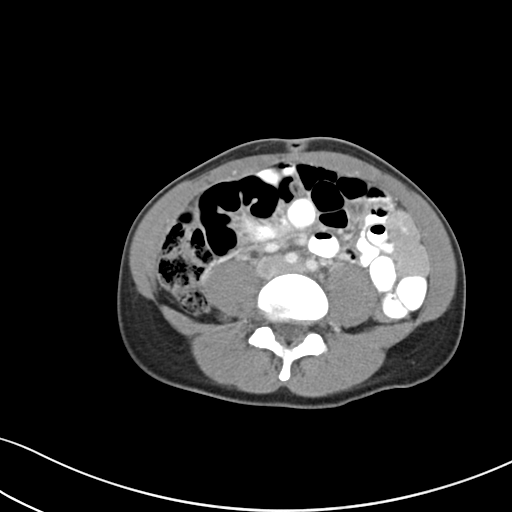
[im 48/87  soft-tissue]
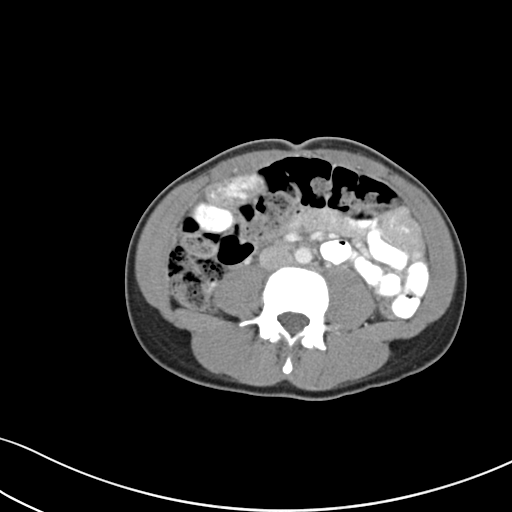
[im 56/87  soft-tissue]
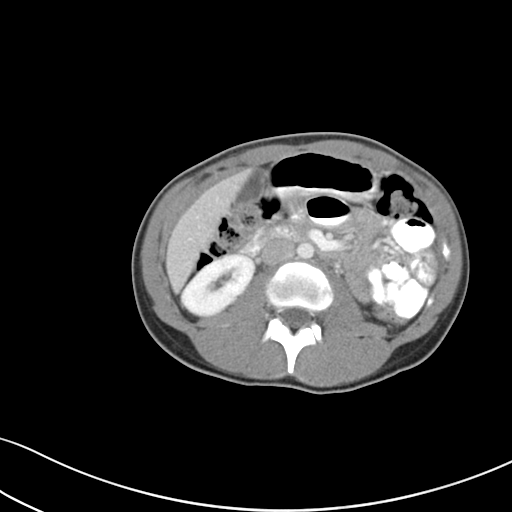
[im 56/87  bone]
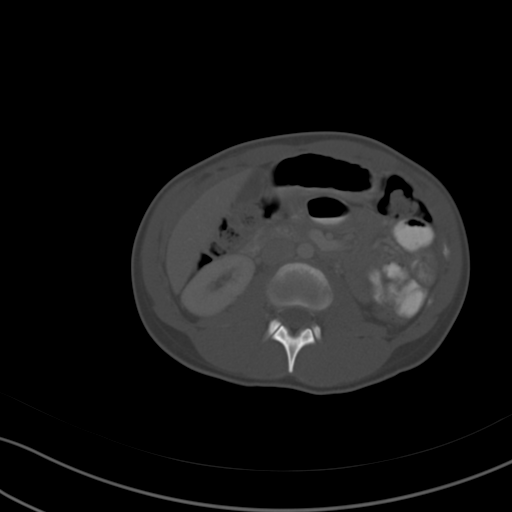
[im 61/87  soft-tissue]
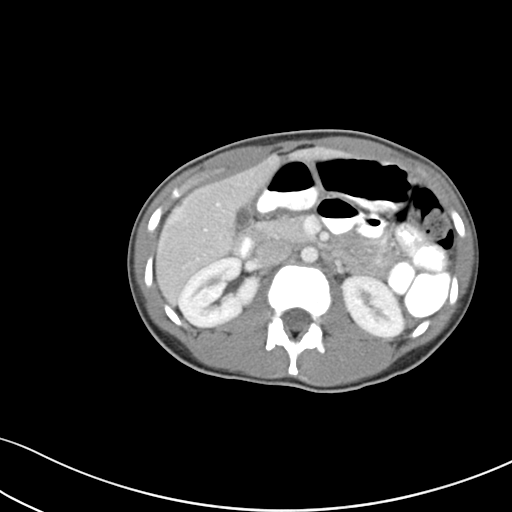
[im 69/87  soft-tissue]
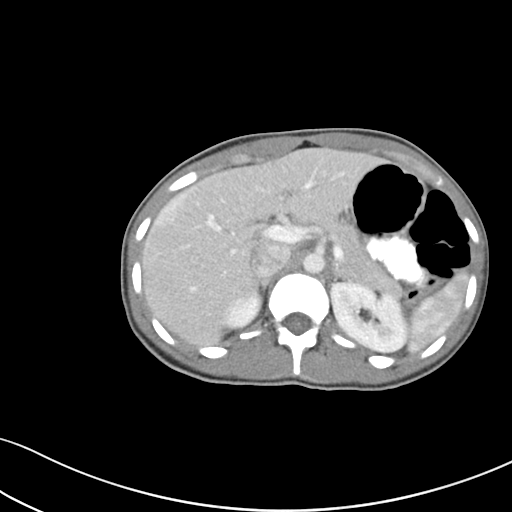
[im 74/87  soft-tissue]
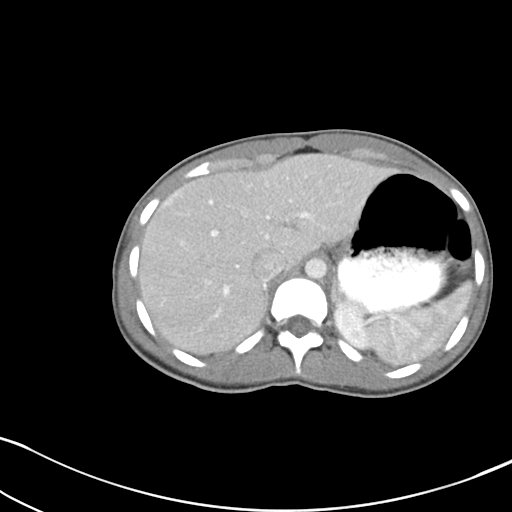
[im 82/87  soft-tissue]
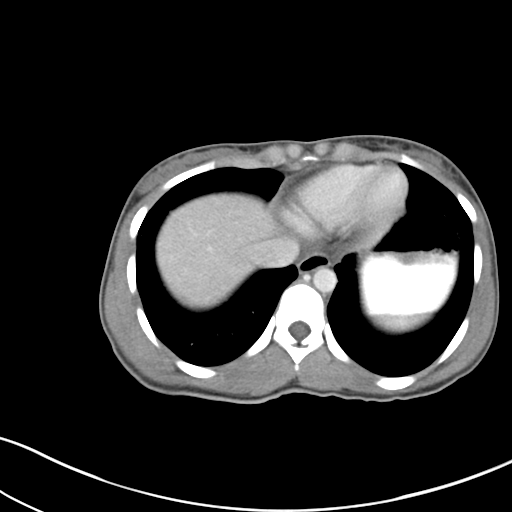

[Series 3: coronal a/|p · coronal · 0.72mm/px · 3 of 125 slices shown]
[im 42/125  soft-tissue]
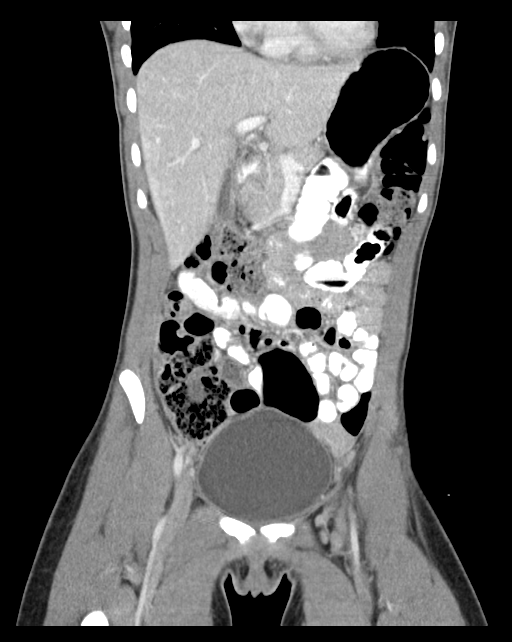
[im 56/125  soft-tissue]
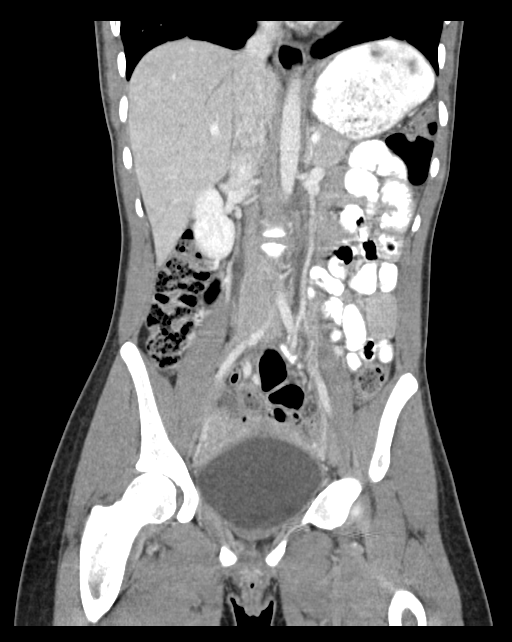
[im 69/125  soft-tissue]
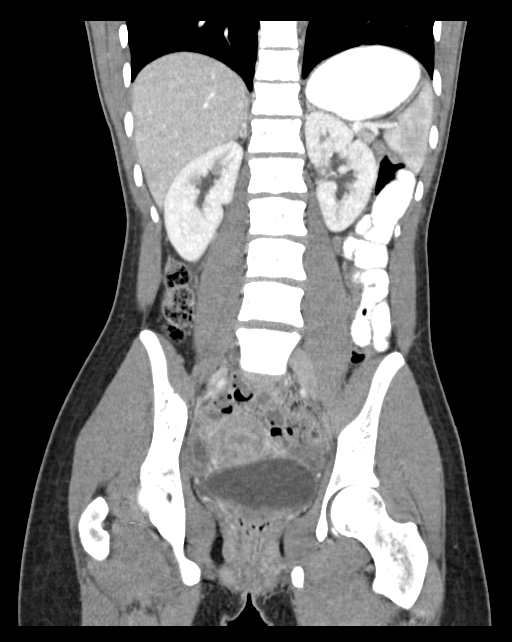

[16 of 46 positions shown; findings below may reference images not displayed]

FINDINGS: Lower chest: No acute abnormality.

Hepatobiliary: No focal liver abnormality is seen. No gallstones,
gallbladder wall thickening, or biliary dilatation.

Pancreas: Unremarkable. No pancreatic ductal dilatation or
surrounding inflammatory changes.

Spleen: Normal in size without focal abnormality.

Adrenals/Urinary Tract: Adrenal glands appear normal. Kidneys are
unremarkable without mass, stone or hydronephrosis. No ureteral or
bladder calculi identified. Bladder appears normal.

Stomach/Bowel: Bowel is normal in caliber. No bowel wall thickening
or convincing evidence of bowel wall inflammation seen. Appendix is
not seen in its entirety but visualized portion appears normal in
caliber and normally air-filled. Stomach appears normal.

Vascular/Lymphatic: No significant vascular findings are present. No
enlarged abdominal or pelvic lymph nodes.

Reproductive: Uterus and bilateral adnexa are unremarkable.

Other: Small amount of free fluid in the lower pelvis is likely
physiologic in nature. No other free fluid. No abscess collection or
free intraperitoneal air seen.

Musculoskeletal: No acute or significant osseous findings.
IMPRESSION: No acute/significant findings within the abdomen or pelvis. No bowel
obstruction or evidence of bowel wall inflammation seen. No
significant free fluid. No evidence of acute solid organ
abnormality. No renal or ureteral calculi seen. No evidence of
appendicitis.
